# Patient Record
Sex: Female | Born: 1985 | Race: White | Hispanic: No | Marital: Single | Smoking: Current every day smoker
Health system: Southern US, Community
[De-identification: ages and names within clinical notes are randomized; demographics above are authoritative.]

## PROBLEM LIST (undated history)

## (undated) ENCOUNTER — Inpatient Hospital Stay (HOSPITAL_COMMUNITY): Payer: Self-pay

## (undated) DIAGNOSIS — R87629 Unspecified abnormal cytological findings in specimens from vagina: Secondary | ICD-10-CM

## (undated) DIAGNOSIS — F419 Anxiety disorder, unspecified: Secondary | ICD-10-CM

## (undated) DIAGNOSIS — F0390 Unspecified dementia without behavioral disturbance: Secondary | ICD-10-CM

## (undated) DIAGNOSIS — N83209 Unspecified ovarian cyst, unspecified side: Secondary | ICD-10-CM

## (undated) HISTORY — PX: LEEP: SHX91

---
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
## 2009-09-29 ENCOUNTER — Emergency Department (HOSPITAL_BASED_OUTPATIENT_CLINIC_OR_DEPARTMENT_OTHER): Admission: EM | Admit: 2009-09-29 | Discharge: 2009-09-30 | Payer: Self-pay | Admitting: Emergency Medicine

## 2009-09-29 ENCOUNTER — Ambulatory Visit: Payer: Self-pay | Admitting: Diagnostic Radiology

## 2009-09-29 IMAGING — US US PELVIS COMPLETE
1 series · 14 of 25 positions shown · non-contrast
Comparison: None

CLINICAL DATA: Lower abdominal pain, nausea, vomiting.

TRANSABDOMINAL AND TRANSVAGINAL ULTRASOUND OF PELVIS
TECHNIQUE: Both transabdominal and transvaginal ultrasound
examinations of the pelvis were performed including evaluation of
the uterus, ovaries, adnexal regions, and pelvic cul-de-sac.

[Series 1: us pelvis complete · 0.22mm/px · 14 of 101 slices shown]
[im 1/101]
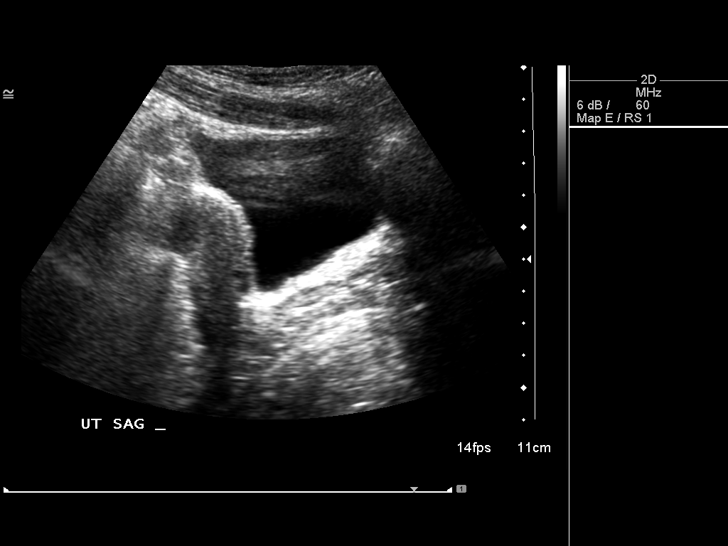
[im 9/101]
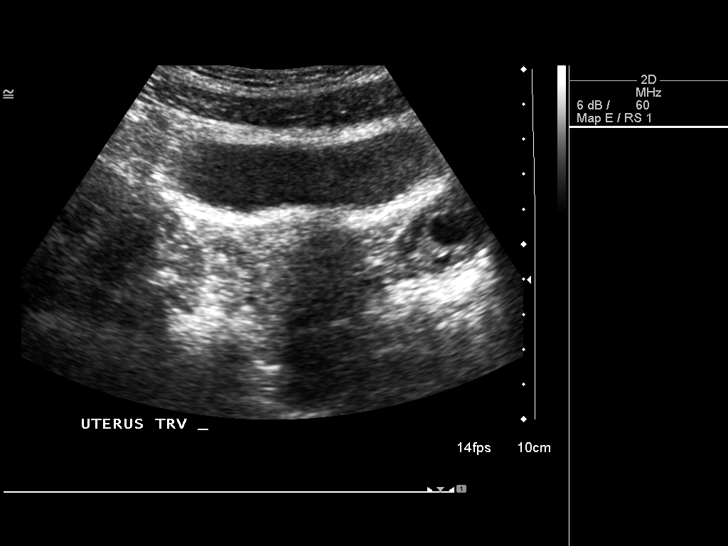
[im 17/101]
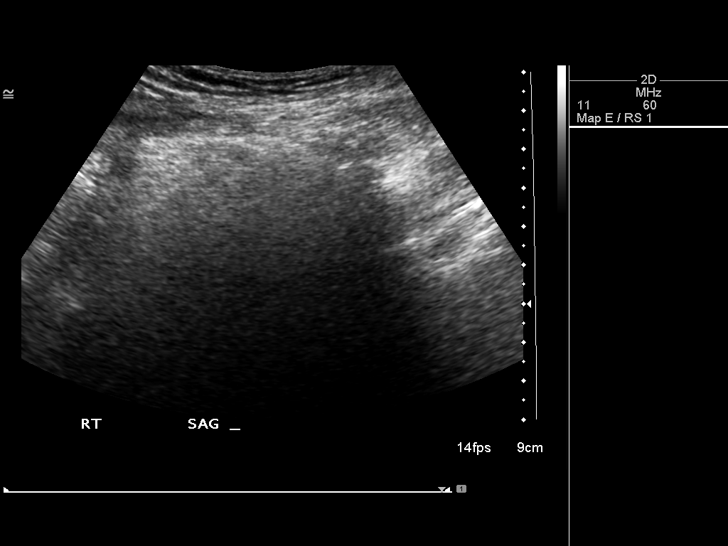
[im 26/101]
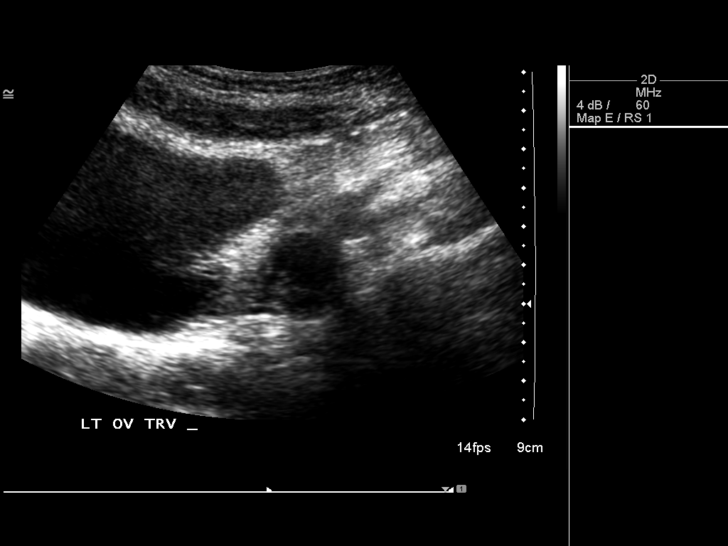
[im 34/101]
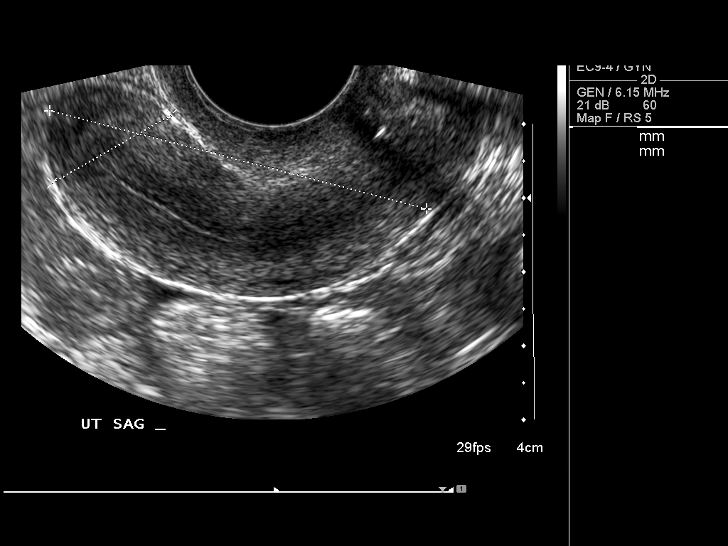
[im 38/101]
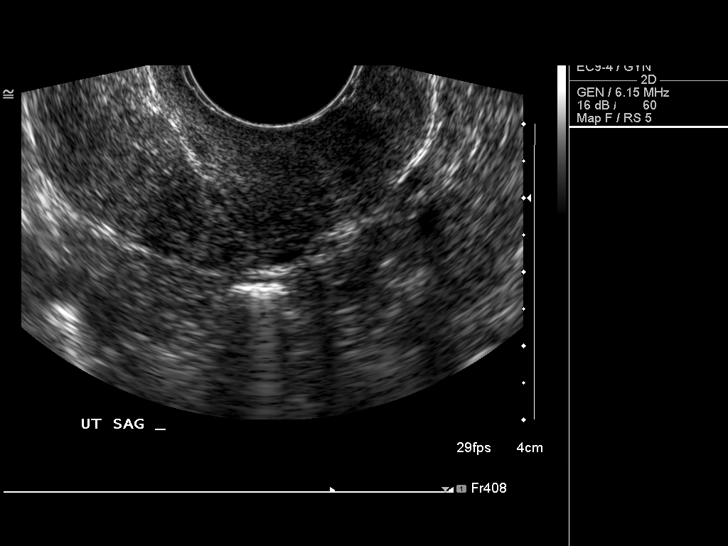
[im 46/101]
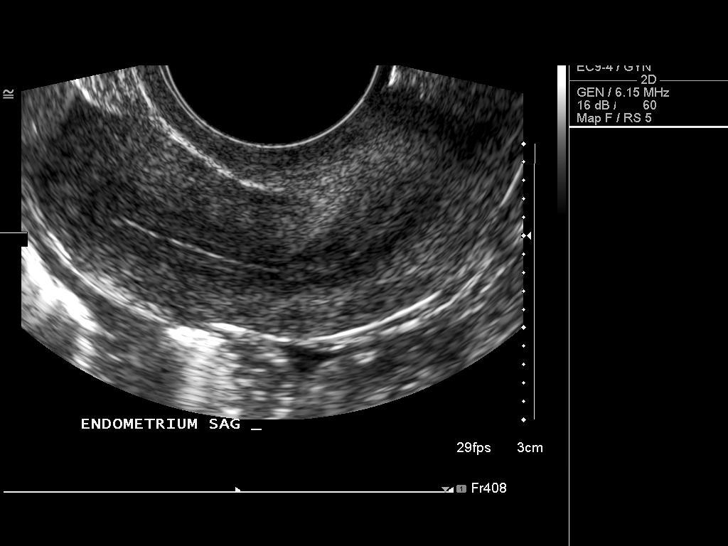
[im 55/101]
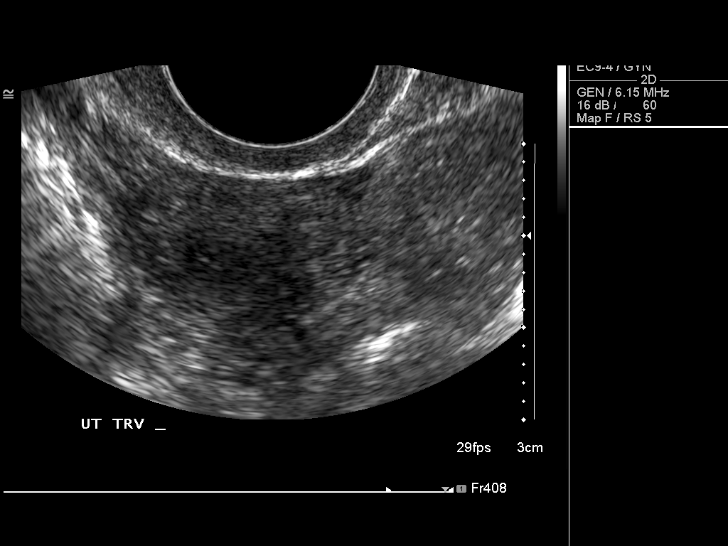
[im 63/101]
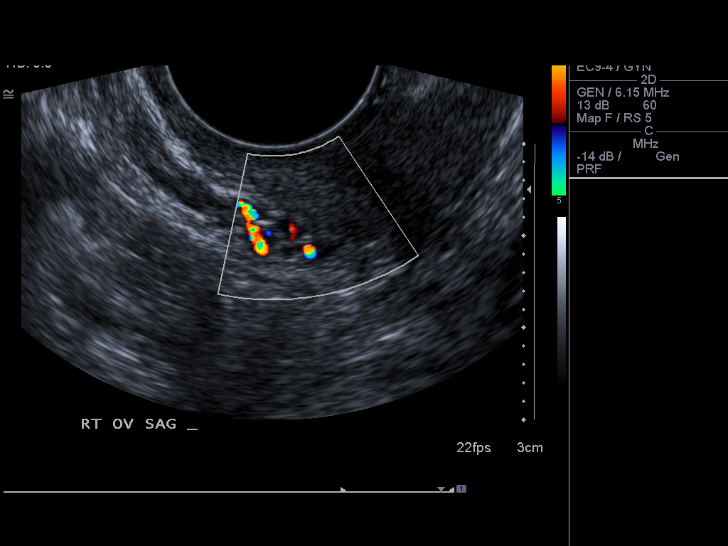
[im 67/101]
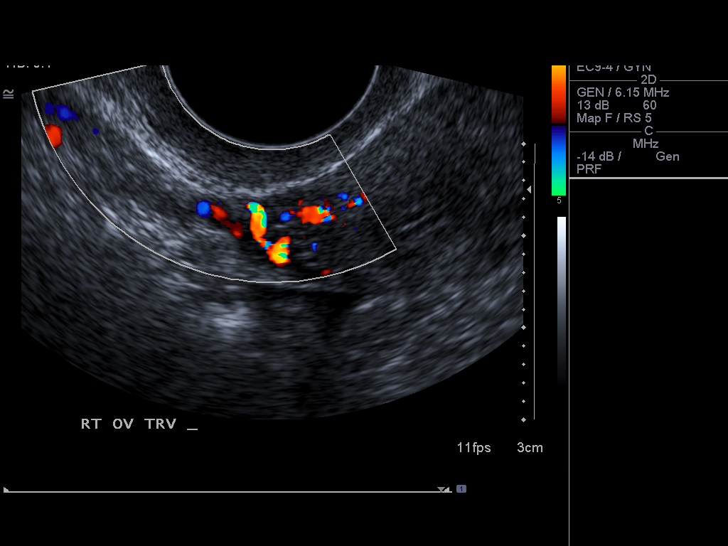
[im 76/101]
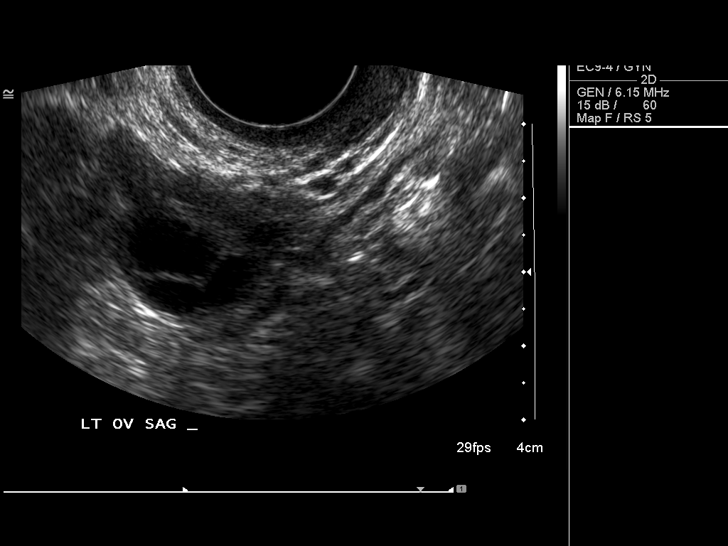
[im 84/101]
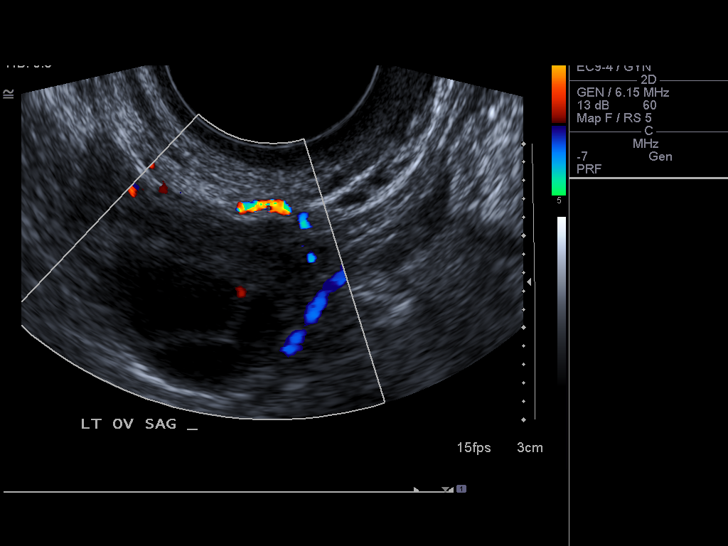
[im 92/101]
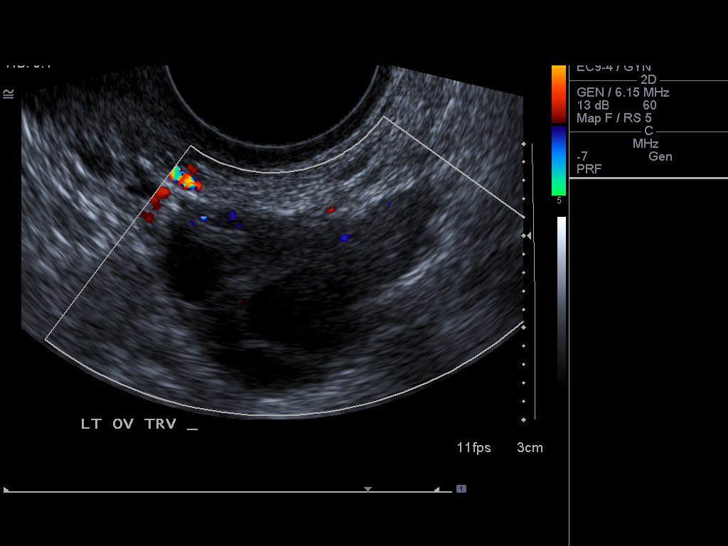
[im 101/101]
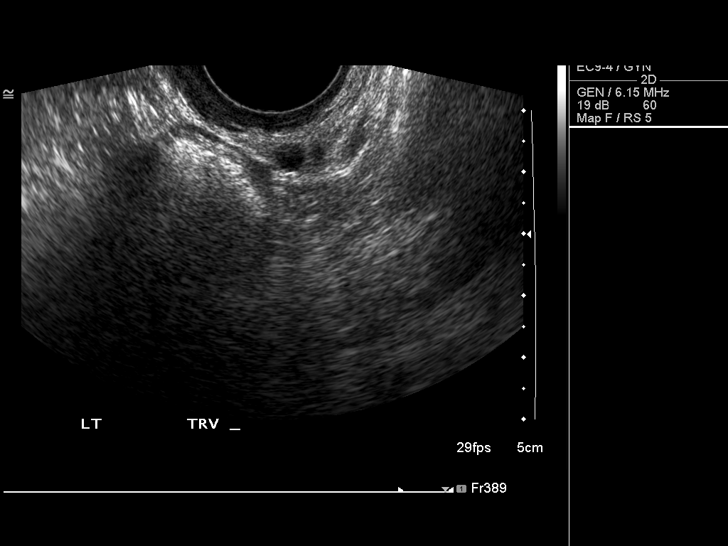

[14 of 25 positions shown; findings below may reference images not displayed]

FINDINGS: Uterus: 6.0 x 2.3 x 4.5 cm.  Normal echotexture.  No focal mass.

Endometrium: Normal, 4 mm.

Right Ovary: 1.5 x 0.8 x 1.0 cm. Normal size and echotexture.  No
adnexal masses.

Left Ovary: 2.3 x 2.0 x 2.3 cm.  Multiple small cystic areas or a
septated complex cyst is seen in the left ovary.  The this measures
up to 2.3 cm.

Other Findings:  No free fluid.
IMPRESSION: Small cysts or complex septated cyst within the left ovary.

## 2009-09-29 IMAGING — CT CT ABD-PELV W/O CM
2 of 4 series · 15 of 46 positions shown, 17 images · non-contrast
Comparison: None

CLINICAL DATA: Abdominal pain.  Elevated creatinine.

CT ABDOMEN AND PELVIS WITHOUT CONTRAST
TECHNIQUE: Multidetector CT imaging of the abdomen and pelvis was
performed following the standard protocol without intravenous
contrast.

[Series 2: abd/pelvis 5.0 b31f · axial · 0.60mm/px · z∈[-441,-26]mm · 12 of 91 slices shown, 14 images]
[im 4/91  soft-tissue]
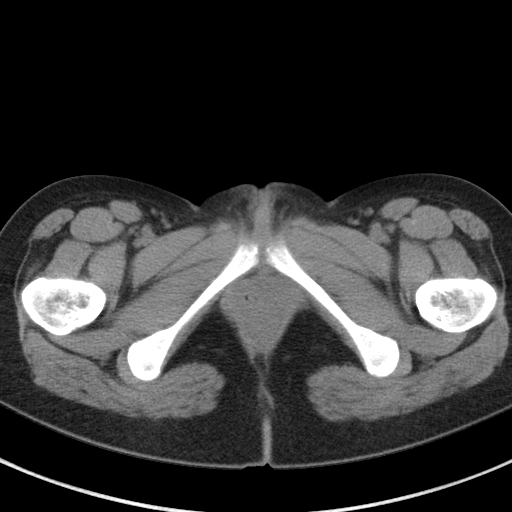
[im 4/91  bone]
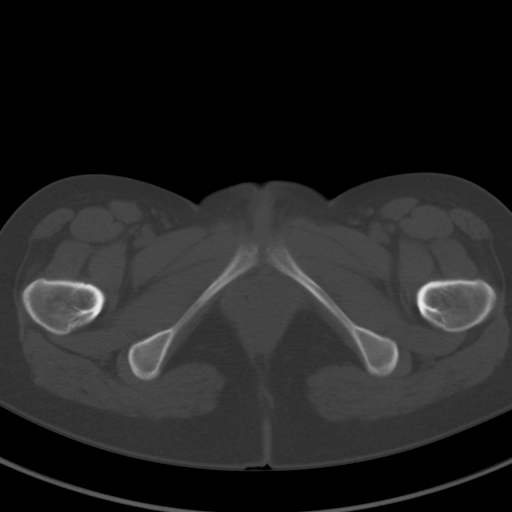
[im 12/91  soft-tissue]
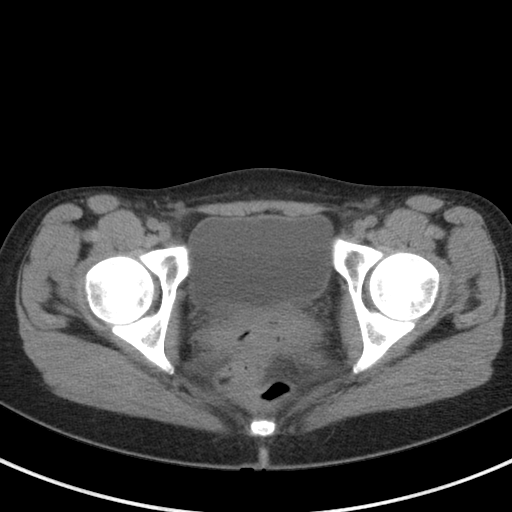
[im 19/91  soft-tissue]
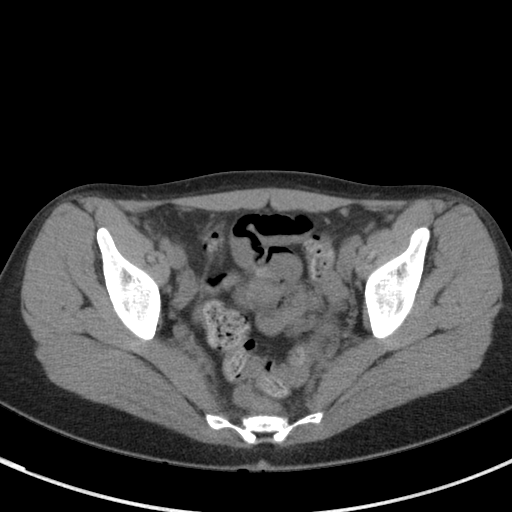
[im 27/91  soft-tissue]
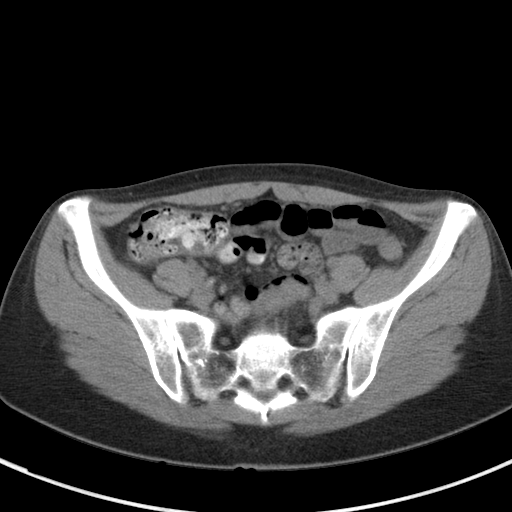
[im 34/91  soft-tissue]
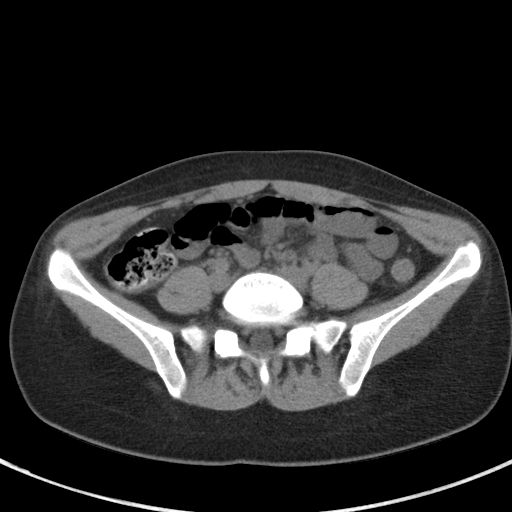
[im 42/91  soft-tissue]
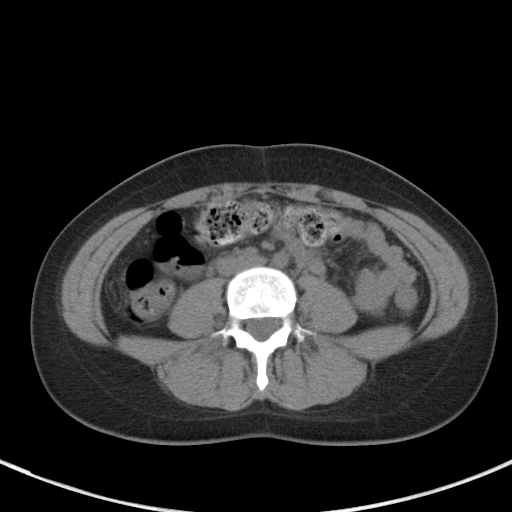
[im 49/91  soft-tissue]
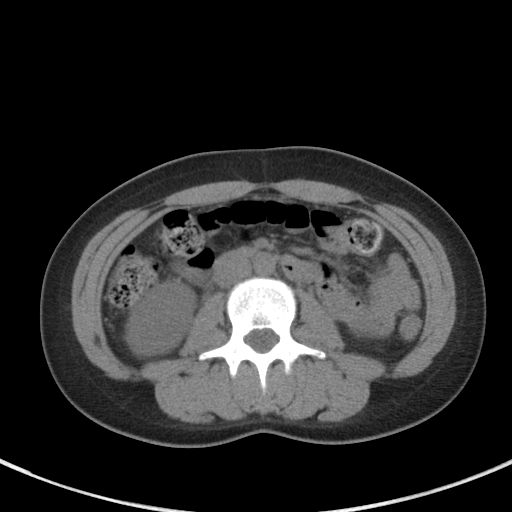
[im 57/91  soft-tissue]
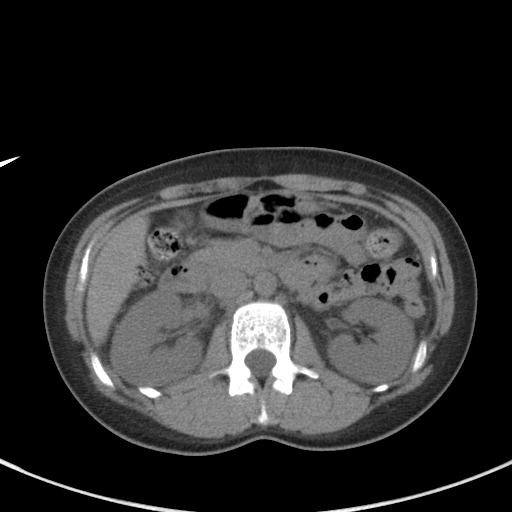
[im 64/91  soft-tissue]
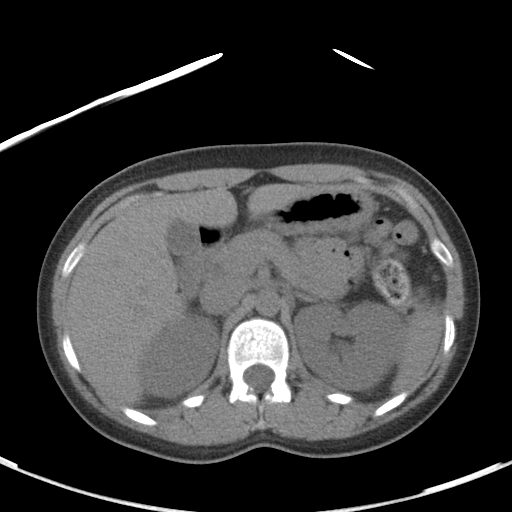
[im 64/91  bone]
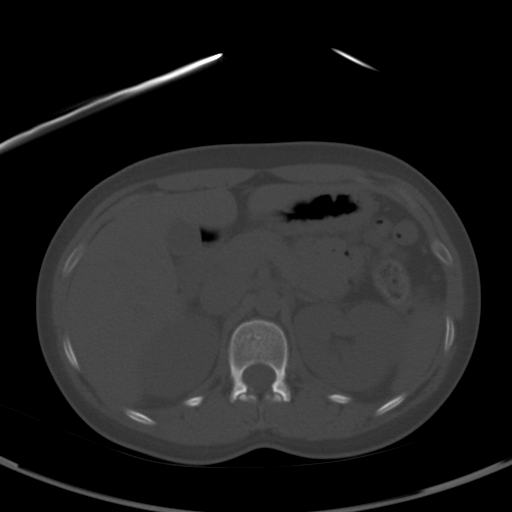
[im 72/91  soft-tissue]
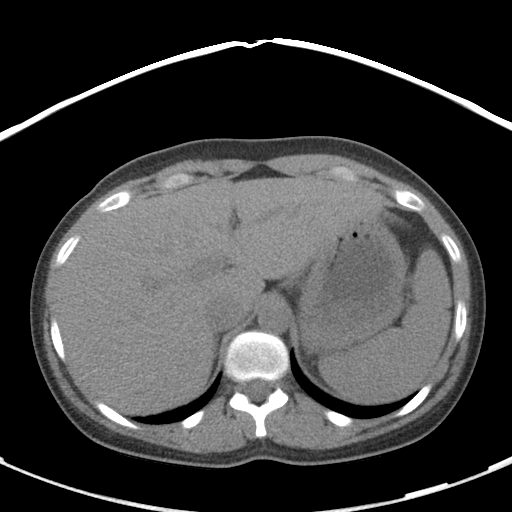
[im 79/91  soft-tissue]
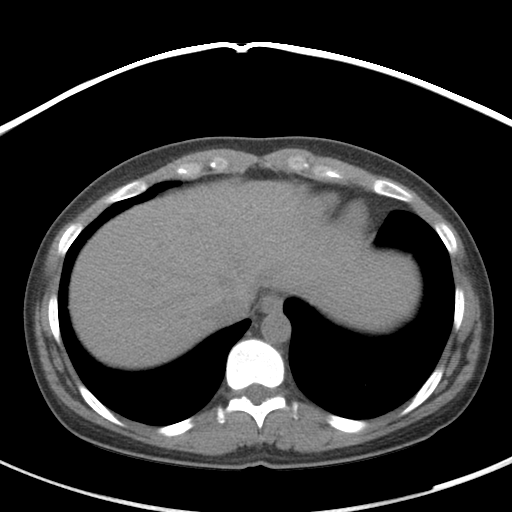
[im 87/91  soft-tissue]
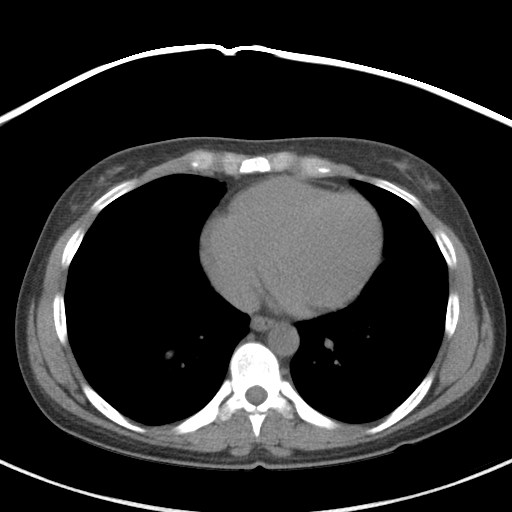

[Series 5: abd/pelvis 3.0 coronal · coronal · 0.59mm/px · 3 of 61 slices shown]
[im 21/61  soft-tissue]
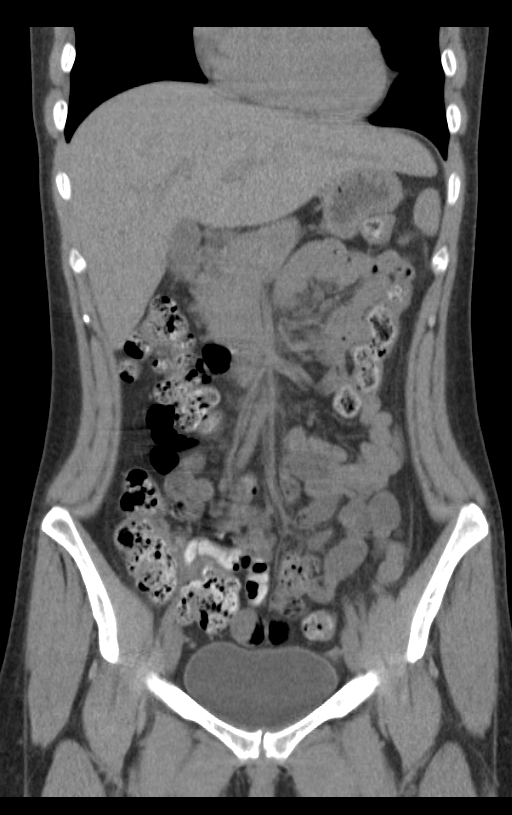
[im 27/61  soft-tissue]
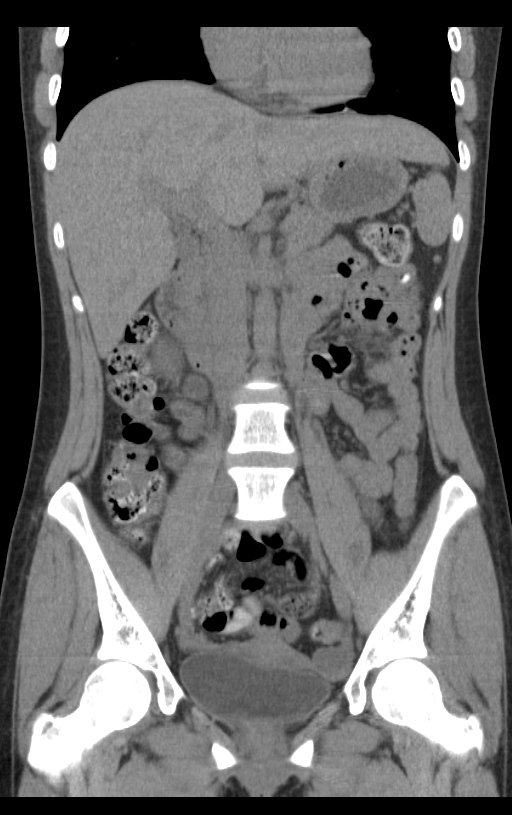
[im 34/61  soft-tissue]
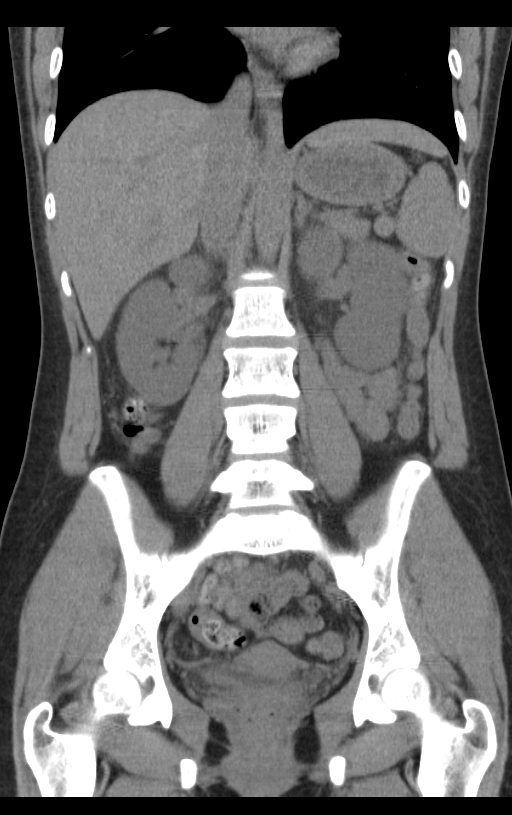

[15 of 46 positions shown; findings below may reference images not displayed]

FINDINGS: Lung bases are clear.  No effusions.  Heart is normal
size.

No renal or proximal ureteral stones.  No hydronephrosis.  There is
slight indistinctness of the renal contours bilaterally, likely
early / mild perinephric stranding.  Liver, gallbladder, spleen,
pancreas, adrenals have an unremarkable unenhanced appearance.
Bowel grossly unremarkable.  No free fluid, free air, or
adenopathy.

Pelvis:  Ureters are decompressed.  No stones.  Bladder
unremarkable.  2.8 cm left ovarian cyst present.  Uterus and right
adnexa have a grossly unremarkable unenhanced appearance.

Appendix is visualized and is normal.  Trace free fluid in the
pelvis.  Pelvic large and small bowel unremarkable.

No acute bony abnormality.
IMPRESSION: Slight indistinctness of the renal contours may reflect early /
mild perinephric stranding.  Cannot exclude pyelonephritis on this
unenhanced scan.  Recommend clinical correlation.  No
hydronephrosis or stones.

Small left ovarian cyst.

## 2010-10-01 LAB — BASIC METABOLIC PANEL
BUN: 21 mg/dL (ref 6–23)
BUN: 24 mg/dL — ABNORMAL HIGH (ref 6–23)
Chloride: 115 mEq/L — ABNORMAL HIGH (ref 96–112)
Creatinine, Ser: 3 mg/dL — ABNORMAL HIGH (ref 0.4–1.2)
Creatinine, Ser: 3.1 mg/dL — ABNORMAL HIGH (ref 0.4–1.2)
GFR calc Af Amer: 22 mL/min — ABNORMAL LOW (ref >60)
GFR calc non Af Amer: 18 mL/min — ABNORMAL LOW (ref >60)
Glucose, Bld: 123 mg/dL — ABNORMAL HIGH (ref 70–99)
Glucose, Bld: 89 mg/dL (ref 70–99)
Potassium: 3.8 mEq/L (ref 3.5–5.1)
Sodium: 143 mEq/L (ref 135–145)

## 2010-10-01 LAB — WET PREP, GENITAL

## 2010-10-01 LAB — RPR: RPR Ser Ql: NONREACTIVE

## 2010-10-01 LAB — URINALYSIS, ROUTINE W REFLEX MICROSCOPIC
Glucose, UA: NEGATIVE mg/dL
Nitrite: NEGATIVE
Protein, ur: NEGATIVE mg/dL
Specific Gravity, Urine: 1.008 (ref 1.005–1.030)

## 2010-10-01 LAB — CBC
HCT: 39.9 % (ref 36.0–46.0)
MCHC: 34.2 g/dL (ref 30.0–36.0)
MCV: 92.1 fL (ref 78.0–100.0)
Platelets: 346 10*3/uL (ref 150–400)

## 2010-10-01 LAB — DIFFERENTIAL
Basophils Absolute: 0.1 10*3/uL (ref 0.0–0.1)
Basophils Relative: 1 % (ref 0–1)
Eosinophils Relative: 1 % (ref 0–5)
Lymphocytes Relative: 10 % — ABNORMAL LOW (ref 12–46)
Lymphs Abs: 1.2 10*3/uL (ref 0.7–4.0)
Monocytes Relative: 5 % (ref 3–12)
Neutro Abs: 10.4 10*3/uL — ABNORMAL HIGH (ref 1.7–7.7)

## 2014-04-11 LAB — OB RESULTS CONSOLE GC/CHLAMYDIA
CHLAMYDIA, DNA PROBE: NEGATIVE
Gonorrhea: NEGATIVE

## 2014-04-11 LAB — SICKLE CELL SCREEN: SICKLE CELL SCREEN: NEGATIVE

## 2014-04-11 LAB — OB RESULTS CONSOLE PLATELET COUNT: PLATELETS: 490 10*3/uL

## 2014-04-11 LAB — OB RESULTS CONSOLE HGB/HCT, BLOOD
HCT: 40 %
HEMOGLOBIN: 12.9 g/dL

## 2014-04-11 LAB — OB RESULTS CONSOLE RPR: RPR: NONREACTIVE

## 2014-04-11 LAB — OB RESULTS CONSOLE HIV ANTIBODY (ROUTINE TESTING): HIV: NONREACTIVE

## 2014-09-08 ENCOUNTER — Encounter (HOSPITAL_COMMUNITY): Payer: Self-pay | Admitting: *Deleted

## 2014-09-08 ENCOUNTER — Inpatient Hospital Stay (HOSPITAL_COMMUNITY)
Admission: AD | Admit: 2014-09-08 | Discharge: 2014-09-08 | Disposition: A | Discharge status: Home / Self Care | Payer: Medicaid Other | Source: Ambulatory Visit | Attending: Obstetrics & Gynecology | Admitting: Obstetrics & Gynecology

## 2014-09-08 ENCOUNTER — Inpatient Hospital Stay (HOSPITAL_COMMUNITY): Payer: Medicaid Other

## 2014-09-08 DIAGNOSIS — Z720 Tobacco use: Secondary | ICD-10-CM | POA: Insufficient documentation

## 2014-09-08 DIAGNOSIS — Z3A31 31 weeks gestation of pregnancy: Secondary | ICD-10-CM | POA: Diagnosis not present

## 2014-09-08 DIAGNOSIS — O26879 Cervical shortening, unspecified trimester: Secondary | ICD-10-CM

## 2014-09-08 DIAGNOSIS — O26893 Other specified pregnancy related conditions, third trimester: Secondary | ICD-10-CM | POA: Diagnosis not present

## 2014-09-08 DIAGNOSIS — O26899 Other specified pregnancy related conditions, unspecified trimester: Secondary | ICD-10-CM | POA: Insufficient documentation

## 2014-09-08 DIAGNOSIS — R1032 Left lower quadrant pain: Secondary | ICD-10-CM | POA: Insufficient documentation

## 2014-09-08 DIAGNOSIS — R1031 Right lower quadrant pain: Secondary | ICD-10-CM | POA: Diagnosis present

## 2014-09-08 DIAGNOSIS — R109 Unspecified abdominal pain: Secondary | ICD-10-CM

## 2014-09-08 HISTORY — DX: Unspecified abnormal cytological findings in specimens from vagina: R87.629

## 2014-09-08 HISTORY — DX: Unspecified ovarian cyst, unspecified side: N83.209

## 2014-09-08 HISTORY — DX: Unspecified dementia, unspecified severity, without behavioral disturbance, psychotic disturbance, mood disturbance, and anxiety: F03.90

## 2014-09-08 HISTORY — DX: Anxiety disorder, unspecified: F41.9

## 2014-09-08 LAB — URINALYSIS, ROUTINE W REFLEX MICROSCOPIC
Bilirubin Urine: NEGATIVE
GLUCOSE, UA: NEGATIVE mg/dL
HGB URINE DIPSTICK: NEGATIVE
KETONES UR: NEGATIVE mg/dL
Nitrite: NEGATIVE
PROTEIN: NEGATIVE mg/dL
Specific Gravity, Urine: 1.015 (ref 1.005–1.030)
UROBILINOGEN UA: 1 mg/dL (ref 0.0–1.0)
pH: 7 (ref 5.0–8.0)

## 2014-09-08 LAB — URINE MICROSCOPIC-ADD ON

## 2014-09-08 IMAGING — US US MFM OB TRANSVAGINAL
1 series · 13 of 16 positions shown · non-contrast
Comparison: none

[Series 1: us mfm ob transvaginal · 16 acquisitions, 13 frames shown]
[im 1/16]
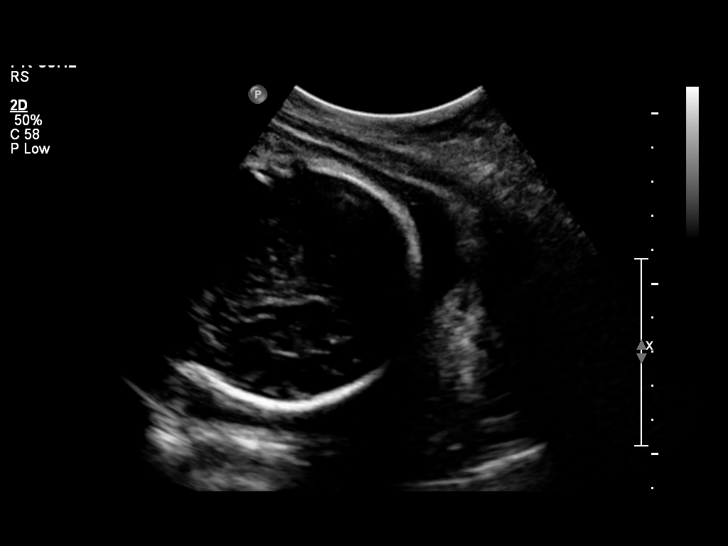
[im 2/16]
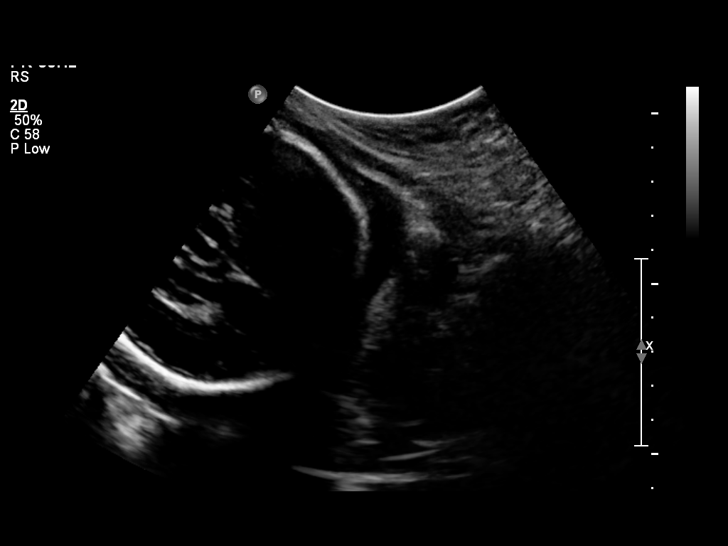
[im 4/16]
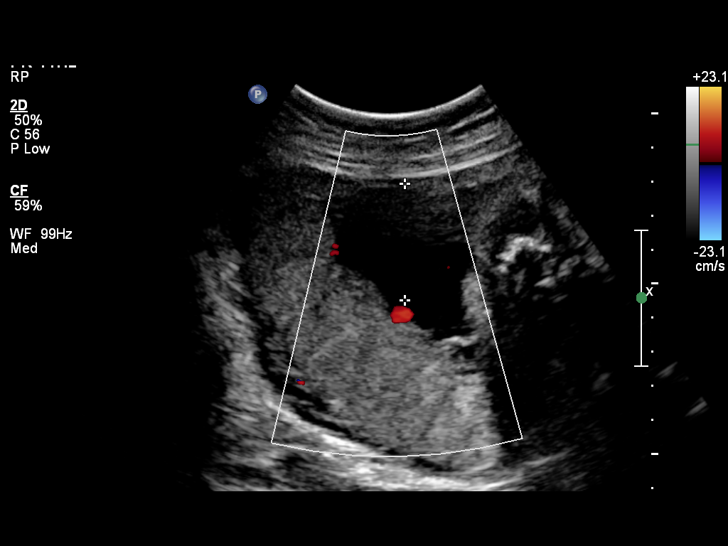
[im 5/16]
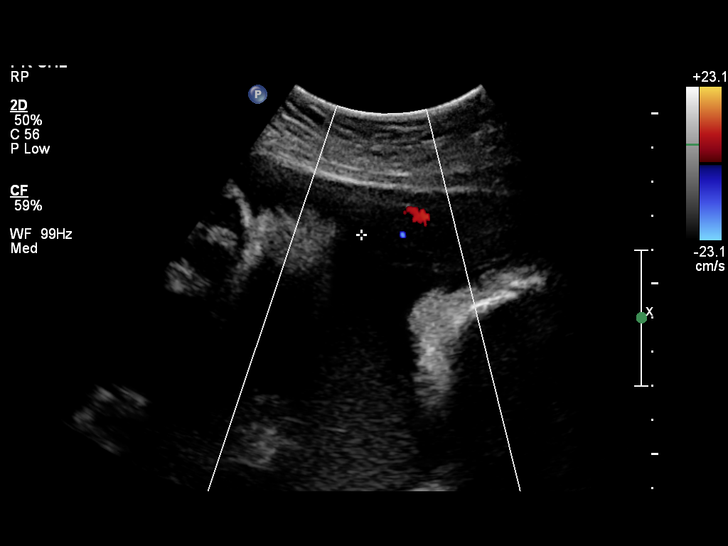
[im 6/16]
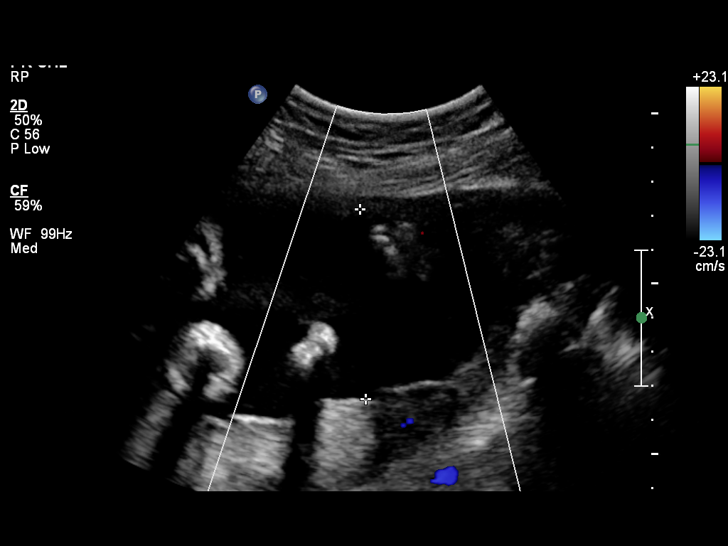
[im 7/16]
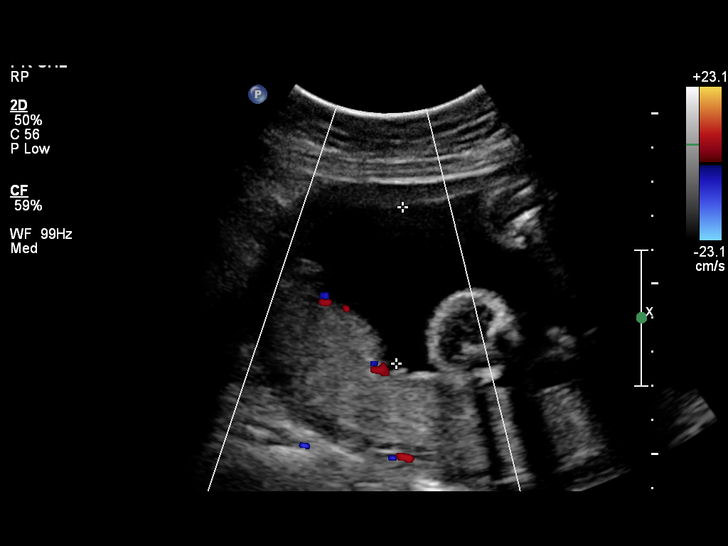
[im 9/16]
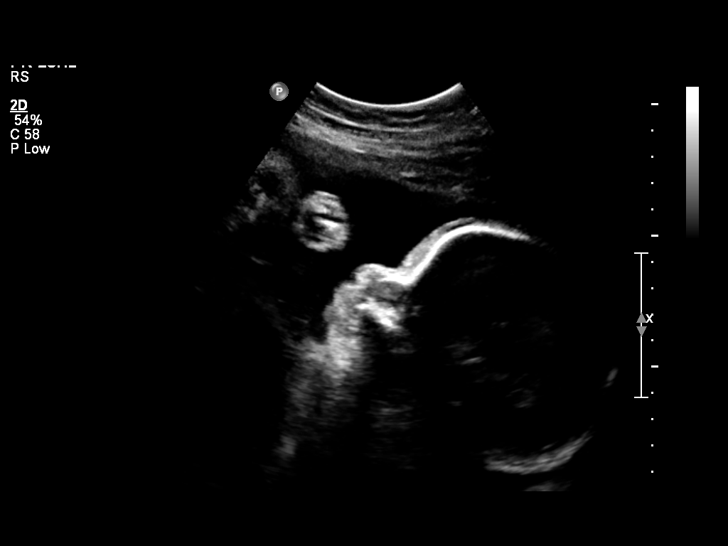
[im 10/16]
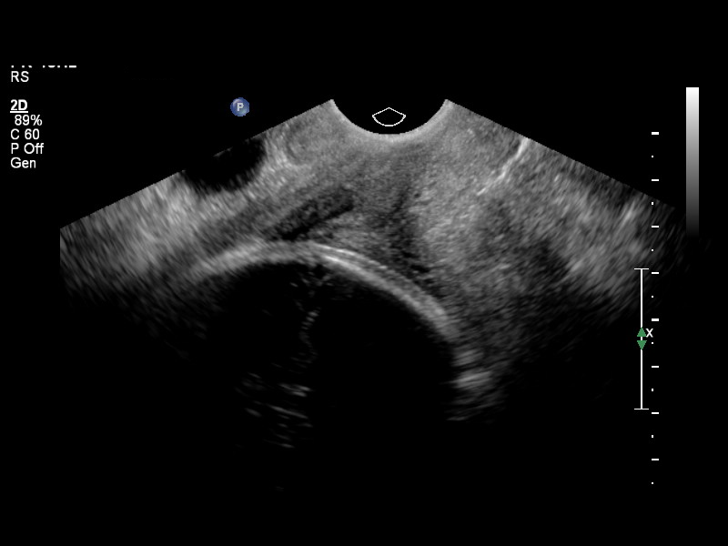
[im 11/16]
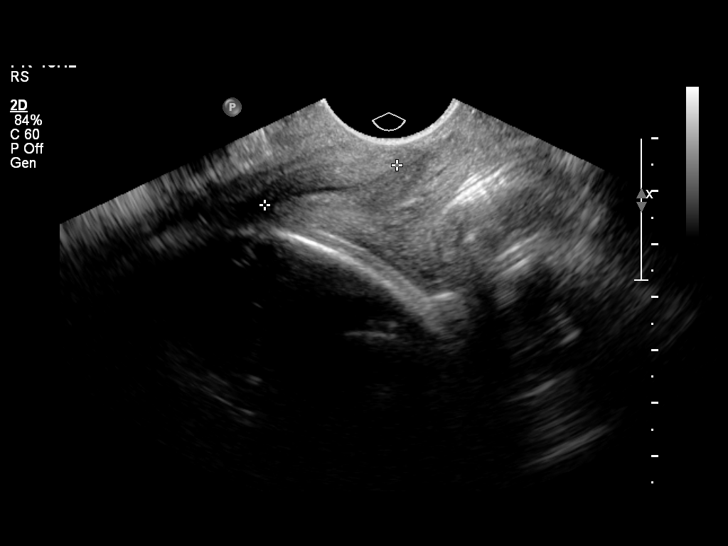
[im 12/16]
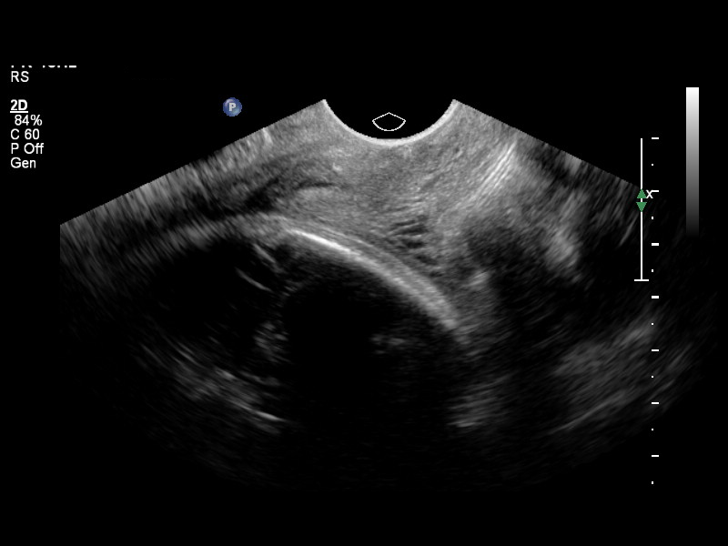
[im 13/16]
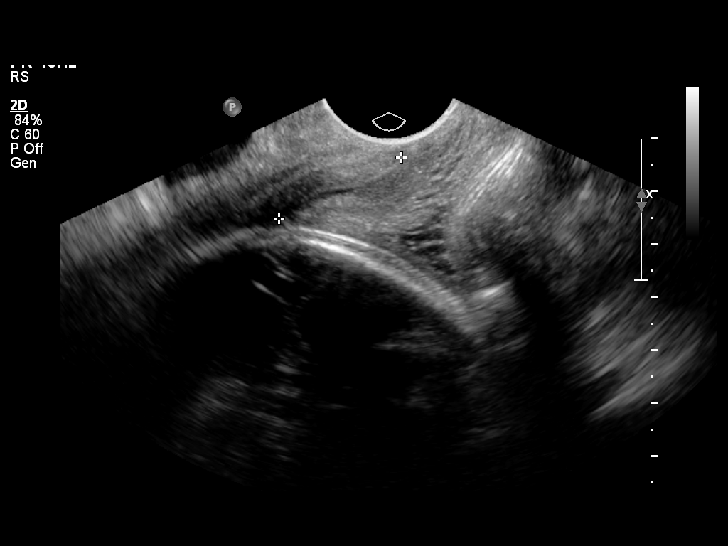
[im 15/16]
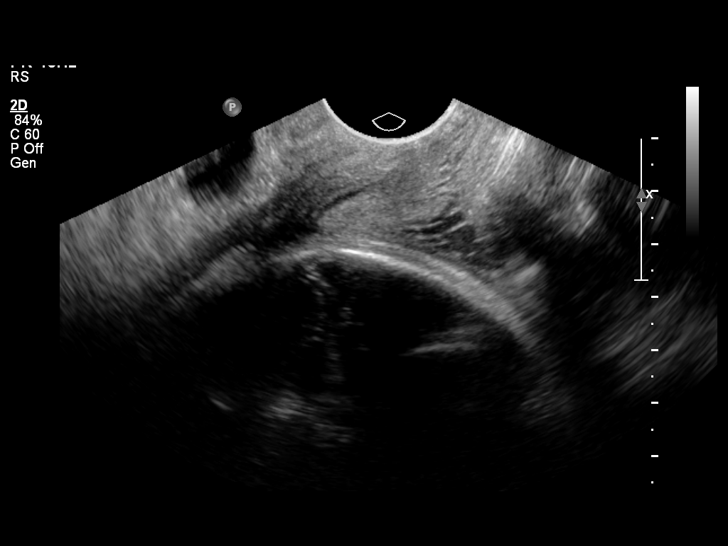
[im 16/16]
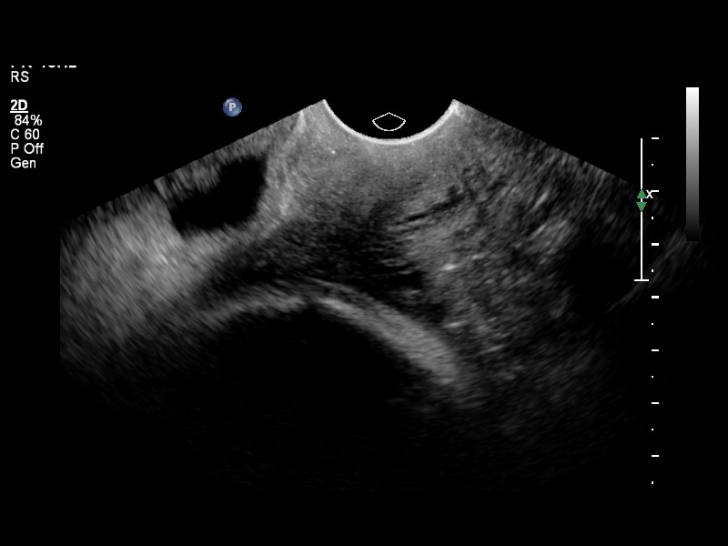

[13 of 16 positions shown; findings below may reference images not displayed]

OBSTETRICS REPORT
                      (Signed Final 09/08/2014 [DATE])

Service(s) Provided

 US MFM OB TRANSVAGINAL                                76817.2
Indications

 31 weeks gestation of pregnancy
 Abdominal pain in pregnancy x 2-3 days
 Cervical Length
Fetal Evaluation

 Num Of Fetuses:    1
 Fetal Heart Rate:  156                          bpm
 Cardiac Activity:  Observed
 Presentation:      Cephalic
 Placenta:          Posterior, above cervical
                    os

 Amniotic Fluid
 AFI FV:      Subjectively upper-normal
 AFI Sum:     21.51   cm       84  %Tile     Larg Pckt:    7.98  cm
 RUQ:   3.41    cm   RLQ:    7.98   cm    LUQ:   4.57    cm   LLQ:    5.55   cm
Gestational Age

 LMP:           31w 0d        Date:  02/03/14                 EDD:   11/10/14
 Best:          31w 0d     Det. By:  LMP  (02/03/14)          EDD:   11/10/14
Cervix Uterus Adnexa

 Cervical Length:    2.5      cm

 Cervix:       Normal appearance by transvaginal scan
Impression

 SIUP at 04w2d (remote read of limited ultrasound only)
 cephalic presentation
 AFI is normal
 no previa
 cervix is closed and measures 2.5cm; no funneling
Recommendations

 Follow up as clinically indicated.

## 2014-09-08 NOTE — Discharge Instructions (Signed)
Third Trimester of Pregnancy The third trimester is from week 29 through week 42, months 7 through 9. The third trimester is a time when the fetus is growing rapidly. At the end of the ninth month, the fetus is about 20 inches in length and weighs 6-10 pounds.  BODY CHANGES Your body goes through many changes during pregnancy. The changes vary from woman to woman.   Your weight will continue to increase. You can expect to gain 25-35 pounds (11-16 kg) by the end of the pregnancy.  You may begin to get stretch marks on your hips, abdomen, and breasts.  You may urinate more often because the fetus is moving lower into your pelvis and pressing on your bladder.  You may develop or continue to have heartburn as a result of your pregnancy.  You may develop constipation because certain hormones are causing the muscles that push waste through your intestines to slow down.  You may develop hemorrhoids or swollen, bulging veins (varicose veins).  You may have pelvic pain because of the weight gain and pregnancy hormones relaxing your joints between the bones in your pelvis. Backaches may result from overexertion of the muscles supporting your posture.  You may have changes in your hair. These can include thickening of your hair, rapid growth, and changes in texture. Some women also have hair loss during or after pregnancy, or hair that feels dry or thin. Your hair will most likely return to normal after your baby is born.  Your breasts will continue to grow and be tender. A yellow discharge may leak from your breasts called colostrum.  Your belly button may stick out.  You may feel short of breath because of your expanding uterus.  You may notice the fetus "dropping," or moving lower in your abdomen.  You may have a bloody mucus discharge. This usually occurs a few days to a week before labor begins.  Your cervix becomes thin and soft (effaced) near your due date. WHAT TO EXPECT AT YOUR PRENATAL  EXAMS  You will have prenatal exams every 2 weeks until week 36. Then, you will have weekly prenatal exams. During a routine prenatal visit:  You will be weighed to make sure you and the fetus are growing normally.  Your blood pressure is taken.  Your abdomen will be measured to track your baby's growth.  The fetal heartbeat will be listened to.  Any test results from the previous visit will be discussed.  You may have a cervical check near your due date to see if you have effaced. At around 36 weeks, your caregiver will check your cervix. At the same time, your caregiver will also perform a test on the secretions of the vaginal tissue. This test is to determine if a type of bacteria, Group B streptococcus, is present. Your caregiver will explain this further. Your caregiver may ask you:  What your birth plan is.  How you are feeling.  If you are feeling the baby move.  If you have had any abnormal symptoms, such as leaking fluid, bleeding, severe headaches, or abdominal cramping.  If you have any questions. Other tests or screenings that may be performed during your third trimester include:  Blood tests that check for low iron levels (anemia).  Fetal testing to check the health, activity level, and growth of the fetus. Testing is done if you have certain medical conditions or if there are problems during the pregnancy. FALSE LABOR You may feel small, irregular contractions that   eventually go away. These are called Braxton Hicks contractions, or false labor. Contractions may last for hours, days, or even weeks before true labor sets in. If contractions come at regular intervals, intensify, or become painful, it is best to be seen by your caregiver.  SIGNS OF LABOR   Menstrual-like cramps.  Contractions that are 5 minutes apart or less.  Contractions that start on the top of the uterus and spread down to the lower abdomen and back.  A sense of increased pelvic pressure or back  pain.  A watery or bloody mucus discharge that comes from the vagina. If you have any of these signs before the 37th week of pregnancy, call your caregiver right away. You need to go to the hospital to get checked immediately. HOME CARE INSTRUCTIONS   Avoid all smoking, herbs, alcohol, and unprescribed drugs. These chemicals affect the formation and growth of the baby.  Follow your caregiver's instructions regarding medicine use. There are medicines that are either safe or unsafe to take during pregnancy.  Exercise only as directed by your caregiver. Experiencing uterine cramps is a good sign to stop exercising.  Continue to eat regular, healthy meals.  Wear a good support bra for breast tenderness.  Do not use hot tubs, steam rooms, or saunas.  Wear your seat belt at all times when driving.  Avoid raw meat, uncooked cheese, cat litter boxes, and soil used by cats. These carry germs that can cause birth defects in the baby.  Take your prenatal vitamins.  Try taking a stool softener (if your caregiver approves) if you develop constipation. Eat more high-fiber foods, such as fresh vegetables or fruit and whole grains. Drink plenty of fluids to keep your urine clear or pale yellow.  Take warm sitz baths to soothe any pain or discomfort caused by hemorrhoids. Use hemorrhoid cream if your caregiver approves.  If you develop varicose veins, wear support hose. Elevate your feet for 15 minutes, 3-4 times a day. Limit salt in your diet.  Avoid heavy lifting, wear low heal shoes, and practice good posture.  Rest a lot with your legs elevated if you have leg cramps or low back pain.  Visit your dentist if you have not gone during your pregnancy. Use a soft toothbrush to brush your teeth and be gentle when you floss.  A sexual relationship may be continued unless your caregiver directs you otherwise.  Do not travel far distances unless it is absolutely necessary and only with the approval  of your caregiver.  Take prenatal classes to understand, practice, and ask questions about the labor and delivery.  Make a trial run to the hospital.  Pack your hospital bag.  Prepare the baby's nursery.  Continue to go to all your prenatal visits as directed by your caregiver. SEEK MEDICAL CARE IF:  You are unsure if you are in labor or if your water has broken.  You have dizziness.  You have mild pelvic cramps, pelvic pressure, or nagging pain in your abdominal area.  You have persistent nausea, vomiting, or diarrhea.  You have a bad smelling vaginal discharge.  You have pain with urination. SEEK IMMEDIATE MEDICAL CARE IF:   You have a fever.  You are leaking fluid from your vagina.  You have spotting or bleeding from your vagina.  You have severe abdominal cramping or pain.  You have rapid weight loss or gain.  You have shortness of breath with chest pain.  You notice sudden or extreme swelling   of your face, hands, ankles, feet, or legs.  You have not felt your baby move in over an hour.  You have severe headaches that do not go away with medicine.  You have vision changes. Document Released: 06/18/2001 Document Revised: 06/29/2013 Document Reviewed: 08/25/2012 Exit[employer] Patient Information 2015 [employer]. This information is not intended to replace advice given to you by your health care provider. Make sure you discuss any questions you have with your health care provider.  

## 2014-09-08 NOTE — MAU Provider Note (Signed)
History     CSN: [id number]  Arrival date and time: 09/08/14 1203   First Provider Initiated Contact with Patient 09/08/14 1429      Chief Complaint  Patient presents with  . Abdominal Pain   HPI   [name] [name] is a 29 y.o. G1P0 at 6463w0d with a 2 day h/o intermittent RLQ and LLQ pain which worsens with movement or change in position.  She is not currently in pain.  Recently moved from [place], last provider visit was in Dec 2015, but denies any problems with this pregnancy, excepting 2 episodes of syncope in early pregnancy.  She has not yet established care with a provider in this area.  No known problems with HTN, diabetes.  Occasional nausea for which she takes an unknown prescribed medication.  No headaches, dizziness, vision changes, edema.    LMP: No period, per pt Dating (per pt): by 11 w U/S.  Due date May 5th.  [facility]  Past Medical History  Diagnosis Date  . Anxiety   . Dementia     doing ok now  . Ovarian cyst   . Vaginal Pap smear, abnormal 2006    LEEP, frequent rechecks    Past Surgical History  Procedure Laterality Date  . Leep      Family History  Problem Relation Age of Onset  . Hypertension Father     History  Substance Use Topics  . Smoking status: Current Every Day Smoker -- 0.50 packs/day for 0 years    Types: Cigarettes  . Smokeless tobacco: Never Used  . Alcohol Use: No    Allergies: No Known Allergies  Prescriptions prior to admission  Medication Sig Dispense Refill Last Dose  . Prenatal Vit-Fe Fumarate-FA (PRENATAL MULTIVITAMIN) TABS tablet Take 1 tablet by mouth daily at 12 noon.   09/08/2014 at Unknown time  . promethazine (PHENERGAN) 25 MG tablet Take 25 mg by mouth every 6 (six) hours as needed for nausea or vomiting.   Past Week at Unknown time    Review of Systems  Constitutional: Negative for fever and chills.  Eyes: Negative for blurred vision, double vision and pain.   Respiratory: Negative for cough and shortness of breath.   Cardiovascular: Negative for chest pain and palpitations.  Gastrointestinal: Positive for nausea. Negative for heartburn, vomiting and abdominal pain.  Genitourinary: Negative for dysuria and hematuria.  Musculoskeletal: Negative for myalgias.  Skin: Negative for rash.  Neurological: Negative for dizziness, loss of consciousness and headaches.   Physical Exam   Blood pressure 118/57, pulse 73, temperature 98 F (36.7 C), temperature source Oral, resp. rate 16, weight 64.411 kg (142 lb).  Physical Exam  Constitutional: She is oriented to person, place, and time. She appears well-developed and well-nourished.  HENT:  Head: Normocephalic and atraumatic.  Eyes: Conjunctivae and EOM are normal. Pupils are equal, round, and reactive to light.  Neck: Normal range of motion.  Cardiovascular: Normal rate, regular rhythm, normal heart sounds and intact distal pulses.   Respiratory: Effort normal and breath sounds normal.  GI: Soft. Bowel sounds are normal. She exhibits distension. She exhibits no mass. There is no tenderness. There is no rebound.  Musculoskeletal: Normal range of motion.  Neurological: She is alert and oriented to person, place, and time.  Skin: Skin is warm and dry.  Psychiatric: She has a normal mood and affect. Her behavior is normal.    MAU Course  Procedures    Assessment and Plan  A: 29 y.o. G1P0 at [redacted]w[redacted]d here for b/l LQ abd pain, intermittent     Location of pain consistent with round ligament pain P: Fetal monitoring with reactive NST; no contractions apparent on monitor     Cervical exam significant for possible early shortening of cervix     U/S confirmed 2.5cm cervical length; slightly shortened     Unlikely PTL; discharged home with return to hospital precautions  [name],[name] 09/08/2014, 3:58 PM   CNM attestation:  I have seen and examined this patient; I agree with above documentation in the  resident's note.   [name] [name] is a 29 y.o. G1P0 reporting bilateral inguinal pain that is intermittent and worsens with movement.  She recently moved from [place] and has not established prenatal care.  She reports good fetal movement, denies cramping/contractions, LOF, vaginal bleeding, vaginal itching/burning, urinary symptoms, h/a, dizziness, n/v, or fever/chills.    PE: BP 107/56 mmHg  Pulse 65  Temp(Src) 98 F (36.7 C) (Oral)  Resp 16  Wt 64.411 kg (142 lb) Gen: calm comfortable, NAD Resp: normal effort, no distress Abd: gravid  Dilation: Closed Cervical Position: Posterior Presentation: Vertex Exam by:: [email or web address]-kirby  Subjectively shortened on exam  ROS, labs, PMH reviewed NST reactive   U/S findings of 2.5 cm cervical length  Plan: Consult Dr Erin Fulling D/C home Fetal kick counts reinforced, PTL labor precautions Rest/ice/heat/warm bath/Tylenol for pain Message sent to [facility] to transfer prenatal care Outpatient f/u ultrasound ordered to measure cervical length in 1 week Return to MAU as needed for emergencies  LEFTWICH-KIRBY, [place] Kappes, CNM 5:38 PM

## 2014-09-08 NOTE — MAU Note (Signed)
Pt c/o sharp pains in her pelvic/vaginal  Area for a few days. Pain comes when changing position or walking. Good fetal movement reported. Pt Just moved from [place] and has had prenatal care there but has not started here. Just got insurance.

## 2014-09-08 NOTE — MAU Note (Signed)
Sharp pain  In lower abd, with certain movement the past couple days.

## 2014-09-15 ENCOUNTER — Ambulatory Visit (HOSPITAL_COMMUNITY): Payer: Medicaid Other | Attending: Advanced Practice Midwife

## 2014-09-22 ENCOUNTER — Ambulatory Visit (INDEPENDENT_AMBULATORY_CARE_PROVIDER_SITE_OTHER): Payer: Medicaid Other | Admitting: Family

## 2014-09-22 VITALS — BP 106/61 | HR 97 | Wt 151.1 lb

## 2014-09-22 DIAGNOSIS — O0933 Supervision of pregnancy with insufficient antenatal care, third trimester: Secondary | ICD-10-CM

## 2014-09-22 DIAGNOSIS — O0993 Supervision of high risk pregnancy, unspecified, third trimester: Secondary | ICD-10-CM

## 2014-09-22 DIAGNOSIS — O0990 Supervision of high risk pregnancy, unspecified, unspecified trimester: Secondary | ICD-10-CM | POA: Insufficient documentation

## 2014-09-22 DIAGNOSIS — Z23 Encounter for immunization: Secondary | ICD-10-CM

## 2014-09-22 DIAGNOSIS — O26879 Cervical shortening, unspecified trimester: Secondary | ICD-10-CM

## 2014-09-22 LAB — POCT URINALYSIS DIP (DEVICE)
Bilirubin Urine: NEGATIVE
GLUCOSE, UA: NEGATIVE mg/dL
HGB URINE DIPSTICK: NEGATIVE
KETONES UR: NEGATIVE mg/dL
LEUKOCYTES UA: NEGATIVE
Nitrite: NEGATIVE
PH: 6.5 (ref 5.0–8.0)
Protein, ur: NEGATIVE mg/dL
SPECIFIC GRAVITY, URINE: 1.02 (ref 1.005–1.030)
UROBILINOGEN UA: 0.2 mg/dL (ref 0.0–1.0)

## 2014-09-22 MED ORDER — PRENATAL VITAMINS 0.8 MG PO TABS: 1.0000 | ORAL_TABLET | Freq: Every day | ORAL | Status: AC

## 2014-09-22 MED ORDER — TETANUS-DIPHTH-ACELL PERTUSSIS 5-2.5-18.5 LF-MCG/0.5 IM SUSP
0.5000 mL | Freq: Once | INTRAMUSCULAR | Status: AC
  Administered 2014-09-22: 0.5 mL via INTRAMUSCULAR

## 2014-09-22 MED ORDER — PROGESTERONE MICRONIZED 200 MG PO CAPS: 200.0000 mg | ORAL_CAPSULE | Freq: Every day | ORAL | Status: AC

## 2014-09-22 NOTE — Progress Notes (Signed)
ROI obtained, [facility] fax#[phone]; tel# [phone].

## 2014-09-22 NOTE — Progress Notes (Signed)
Had care in [place]. Need to request records. Last visit in December.  Needs rx for PNV. Plans to breast feed.

## 2014-09-22 NOTE — Addendum Note (Signed)
Addended by: Faythe CasaBELLAMY, [place] Luddy M on: 09/22/2014 03:14 PM   Modules accepted: Orders

## 2014-09-22 NOTE — Progress Notes (Signed)
Pt is a transfer from [place].  Last visit in December.  Seen in MAU on 09/08/14 for abdominal pain.  Ultrasound showed 2.5 cm cervical length.  Patient here today with no report of contractions/pain.  Denies vaginal bleeding or LOF.  Requesting records from [place].  Patient shares may be going back to [place].  Uncertain at this time.  Denies any additional problems in pregnancy.  Third trimester labs obtained today (1 hr, RPR, CBC, HIV).  RX prometrium HS.  Reviewed signs of preterm labor.  Tdap today.

## 2014-09-23 LAB — PRENATAL PROFILE (SOLSTAS)
ANTIBODY SCREEN: NEGATIVE
BASOS ABS: 0 10*3/uL (ref 0.0–0.1)
BASOS PCT: 0 % (ref 0–1)
EOS ABS: 0.2 10*3/uL (ref 0.0–0.7)
Eosinophils Relative: 2 % (ref 0–5)
HEMATOCRIT: 34.6 % — AB (ref 36.0–46.0)
HIV 1&2 Ab, 4th Generation: NONREACTIVE
Hemoglobin: 11.3 g/dL — ABNORMAL LOW (ref 12.0–15.0)
Hepatitis B Surface Ag: NEGATIVE
Lymphocytes Relative: 25 % (ref 12–46)
Lymphs Abs: 2.4 10*3/uL (ref 0.7–4.0)
MCH: 29.5 pg (ref 26.0–34.0)
MCHC: 32.7 g/dL (ref 30.0–36.0)
MCV: 90.3 fL (ref 78.0–100.0)
MONO ABS: 0.8 10*3/uL (ref 0.1–1.0)
MPV: 9.8 fL (ref 8.6–12.4)
Monocytes Relative: 8 % (ref 3–12)
NEUTROS ABS: 6.3 10*3/uL (ref 1.7–7.7)
Neutrophils Relative %: 65 % (ref 43–77)
Platelets: 311 10*3/uL (ref 150–400)
RBC: 3.83 MIL/uL — AB (ref 3.87–5.11)
RDW: 13.9 % (ref 11.5–15.5)
RUBELLA: 3.54 {index} — AB (ref <0.90)
Rh Type: POSITIVE
WBC: 9.7 10*3/uL (ref 4.0–10.5)

## 2014-09-23 LAB — GLUCOSE TOLERANCE, 1 HOUR (50G) W/O FASTING: Glucose, 1 Hour GTT: 81 mg/dL (ref 70–140)

## 2014-09-23 LAB — CULTURE, OB URINE
Colony Count: NO GROWTH
ORGANISM ID, BACTERIA: NO GROWTH

## 2014-09-28 ENCOUNTER — Encounter: Payer: Self-pay | Admitting: *Deleted

## 2014-09-28 LAB — OXYCODONE, URINE (LC/MS-MS)
Noroxycodone, Ur: 176 ng/mL — AB (ref <50)
Oxycodone, ur: NEGATIVE ng/mL (ref <50)
Oxymorphone: 163 ng/mL — AB (ref <50)

## 2014-09-28 LAB — BUPRENORPHINES (GC/LC/MS), URINE
BUPRENORPHINE (GC/LC/MS): 18.5 ng/mL — AB (ref <2)
Norbuprenorphine: 210.4 ng/mL — AB (ref <2)

## 2014-09-29 ENCOUNTER — Encounter: Payer: Self-pay | Admitting: Family Medicine

## 2014-09-29 DIAGNOSIS — Z8659 Personal history of other mental and behavioral disorders: Secondary | ICD-10-CM | POA: Insufficient documentation

## 2014-09-29 DIAGNOSIS — F17200 Nicotine dependence, unspecified, uncomplicated: Secondary | ICD-10-CM | POA: Insufficient documentation

## 2014-09-29 DIAGNOSIS — Z9889 Other specified postprocedural states: Secondary | ICD-10-CM | POA: Insufficient documentation

## 2014-09-29 DIAGNOSIS — R825 Elevated urine levels of drugs, medicaments and biological substances: Secondary | ICD-10-CM | POA: Insufficient documentation

## 2014-09-29 DIAGNOSIS — O3440 Maternal care for other abnormalities of cervix, unspecified trimester: Secondary | ICD-10-CM | POA: Insufficient documentation

## 2014-09-29 LAB — PRESCRIPTION MONITORING PROFILE (19 PANEL)
Amphetamine/Meth: NEGATIVE ng/mL
BARBITURATE SCREEN, URINE: NEGATIVE ng/mL
Benzodiazepine Screen, Urine: NEGATIVE ng/mL
CANNABINOID SCRN UR: NEGATIVE ng/mL
COCAINE METABOLITES: NEGATIVE ng/mL
CREATININE, URINE: 67.58 mg/dL (ref >20.0)
Carisoprodol, Urine: NEGATIVE ng/mL
FENTANYL URINE: NEGATIVE ng/mL
MDMA URINE: NEGATIVE ng/mL
METHAQUALONE SCREEN (URINE): NEGATIVE ng/mL
Meperidine, Ur: NEGATIVE ng/mL
Methadone Screen, Urine: NEGATIVE ng/mL
Nitrites, Initial: NEGATIVE ug/mL
OPIATE SCREEN, URINE: NEGATIVE ng/mL
PH URINE, INITIAL: 6.9 pH (ref 4.5–8.9)
PHENCYCLIDINE, UR: NEGATIVE ng/mL
Propoxyphene: NEGATIVE ng/mL
Tapentadol, urine: NEGATIVE ng/mL
Tramadol Scrn, Ur: NEGATIVE ng/mL
Zolpidem, Urine: NEGATIVE ng/mL

## 2014-10-06 ENCOUNTER — Encounter: Payer: Medicaid Other | Admitting: Obstetrics and Gynecology

## 2015-07-14 ENCOUNTER — Encounter (HOSPITAL_COMMUNITY): Payer: Self-pay | Admitting: *Deleted
